# Patient Record
Sex: Female | Born: 1985 | Race: Black or African American | Hispanic: No | Marital: Single | Smoking: Never smoker
Health system: Southern US, Community
[De-identification: ages and names within clinical notes are randomized; demographics above are authoritative.]

## PROBLEM LIST (undated history)

## (undated) DIAGNOSIS — Z8619 Personal history of other infectious and parasitic diseases: Secondary | ICD-10-CM

## (undated) DIAGNOSIS — Z789 Other specified health status: Secondary | ICD-10-CM

## (undated) HISTORY — PX: ECTOPIC PREGNANCY SURGERY: SHX613

## (undated) HISTORY — DX: Personal history of other infectious and parasitic diseases: Z86.19

---

## 2013-05-03 NOTE — L&D Delivery Note (Signed)
Patient was C/C/+2 and pushed for a total of approx 45 minutes with epidural.   NSVD  female infant, Apgars 9/9, weight pending.   The patient had no lacerations. Fundus was firm. EBL was expected amount. Placenta was delivered intact. Vagina was clear.  Baby was vigorous and doing skin to skin with mother.  Philip AspenALLAHAN, Tara Ayers

## 2013-09-20 LAB — OB RESULTS CONSOLE RPR: RPR: NONREACTIVE

## 2013-09-20 LAB — OB RESULTS CONSOLE ANTIBODY SCREEN: Antibody Screen: NEGATIVE

## 2013-09-20 LAB — OB RESULTS CONSOLE HIV ANTIBODY (ROUTINE TESTING): HIV: NONREACTIVE

## 2013-09-20 LAB — OB RESULTS CONSOLE ABO/RH: RH TYPE: POSITIVE

## 2013-09-20 LAB — OB RESULTS CONSOLE HEPATITIS B SURFACE ANTIGEN: Hepatitis B Surface Ag: NEGATIVE

## 2013-09-20 LAB — OB RESULTS CONSOLE GC/CHLAMYDIA
Chlamydia: NEGATIVE
Gonorrhea: NEGATIVE

## 2013-09-20 LAB — OB RESULTS CONSOLE RUBELLA ANTIBODY, IGM: RUBELLA: IMMUNE

## 2014-01-23 ENCOUNTER — Other Ambulatory Visit (HOSPITAL_COMMUNITY): Payer: Self-pay | Admitting: Obstetrics and Gynecology

## 2014-01-23 ENCOUNTER — Other Ambulatory Visit: Payer: Self-pay

## 2014-01-23 DIAGNOSIS — O09292 Supervision of pregnancy with other poor reproductive or obstetric history, second trimester: Secondary | ICD-10-CM

## 2014-02-01 ENCOUNTER — Encounter (HOSPITAL_COMMUNITY): Payer: Self-pay

## 2014-02-01 ENCOUNTER — Ambulatory Visit (HOSPITAL_COMMUNITY)
Admission: RE | Admit: 2014-02-01 | Discharge: 2014-02-01 | Disposition: A | Discharge status: Home / Self Care | Payer: 59 | Source: Ambulatory Visit | Attending: Obstetrics and Gynecology | Admitting: Obstetrics and Gynecology

## 2014-02-01 DIAGNOSIS — O09292 Supervision of pregnancy with other poor reproductive or obstetric history, second trimester: Secondary | ICD-10-CM

## 2014-02-01 DIAGNOSIS — Z3A30 30 weeks gestation of pregnancy: Secondary | ICD-10-CM

## 2014-02-01 DIAGNOSIS — O09293 Supervision of pregnancy with other poor reproductive or obstetric history, third trimester: Secondary | ICD-10-CM

## 2014-02-01 DIAGNOSIS — Z3689 Encounter for other specified antenatal screening: Secondary | ICD-10-CM

## 2014-02-01 NOTE — Progress Notes (Signed)
MATERNAL FETAL MEDICINE CONSULT  Patient Name: Tara Ayers Medical Record Number:  [id number] Date of Birth: April 21, 1986 Requesting Physician Name:  Philip AspenSidney Callahan, DO Date of Service: 02/01/2014  Chief Complaint Prior intrauterine fetal demise  History of Present Illness Tara Ayers was seen today secondary to prior unexplained intrauterine fetal demise at 38 weeks at the request of Philip AspenSidney Callahan, DO.  The patient is a 28 y.o. [id number],at 2660w4d with an EDD of 04/08/2014, by Last Menstrual Period dating method.  She reports that she experienced decreased fetal movement in the days leading up to the diagnosis of the IUFD.  Thereafter she had a uncomplicated NSVD after an IOL.  She reports there umbilical cord was hypercoiled, but no clear etiology for the demise was uncovered.  She was induced at 38 weeks for oligohydramnios in her first successful pregnancy.  She has had no complication in her current pregnancy.  Review of Systems Pertinent items are noted in HPI.  Patient History OB History  Gravida Para Term Preterm AB SAB TAB Ectopic Multiple Living  6 2 2  0 3 2 0 1 0 1    # Outcome Date GA Lbr Len/2nd Weight Sex Delivery Anes PTL Lv  6 CUR           5 SAB 2014          4 SAB 2014          3 TRM 2014 6465w0d  5 lb 7 oz (2.466 kg)  SVD   N  2 TRM 2009 2926w0d  5 lb 11 oz (2.58 kg) F SVD   Y  1 ECT 2008              Past Medical and Surgical History Tara Ayers had a laparoscopy for treatment of and ectopic pregnancy.  Otherwise the patient has no history of chronic medical diseases or prior surgeries.  Family History The patient and the father of the baby have no family history of mental retardation, birth defects, or genetic diseases.  Physical Examination Vitals - BP 122/63, Pulse 101, Weight 170 lbs. General appearance - alert, well appearing, and in no distress  Assessment and Recommendations 1.  Prior unexplained IUFD at term.  Tara Ayers inquired about the  possibility of being delivered at approximately 37 weeks; however, an early term delivery is not indicated in this situation as the risk of a recurrent IUFD is exceedingly small.  Current guidelines call for delivery no earlier than 39 weeks unless a fetal or maternal indication arises.  Tara Ayers should continue to have weekly fetal testing until delivery.  I spent 15 minutes with Tara Ayers today of which 50% was face-to-face counseling.  [facility].  Please do not hesitate to contact us with questions.   Rema FendtNITSCHE,Derrin Currey, MD

## 2014-03-05 ENCOUNTER — Encounter (HOSPITAL_COMMUNITY): Payer: Self-pay

## 2014-03-06 LAB — OB RESULTS CONSOLE GBS: GBS: NEGATIVE

## 2014-03-27 ENCOUNTER — Encounter (HOSPITAL_COMMUNITY): Payer: Self-pay | Admitting: *Deleted

## 2014-03-27 ENCOUNTER — Telehealth (HOSPITAL_COMMUNITY): Payer: Self-pay | Admitting: *Deleted

## 2014-03-27 NOTE — Telephone Encounter (Signed)
Preadmission screen  

## 2014-03-29 ENCOUNTER — Inpatient Hospital Stay (HOSPITAL_COMMUNITY)
Admission: AD | Admit: 2014-03-29 | Discharge: 2014-04-01 | DRG: 775 | Disposition: A | Discharge status: Home / Self Care | Payer: 59 | Source: Ambulatory Visit | Attending: Obstetrics and Gynecology | Admitting: Obstetrics and Gynecology

## 2014-03-29 ENCOUNTER — Encounter (HOSPITAL_COMMUNITY): Payer: Self-pay | Admitting: *Deleted

## 2014-03-29 DIAGNOSIS — Z3490 Encounter for supervision of normal pregnancy, unspecified, unspecified trimester: Secondary | ICD-10-CM

## 2014-03-29 DIAGNOSIS — Z3A38 38 weeks gestation of pregnancy: Secondary | ICD-10-CM | POA: Diagnosis present

## 2014-03-29 DIAGNOSIS — O09293 Supervision of pregnancy with other poor reproductive or obstetric history, third trimester: Secondary | ICD-10-CM

## 2014-03-29 HISTORY — DX: Other specified health status: Z78.9

## 2014-03-29 LAB — POCT FERN TEST: POCT Fern Test: POSITIVE

## 2014-03-29 LAB — AMNISURE RUPTURE OF MEMBRANE (ROM) NOT AT ARMC: AMNISURE: POSITIVE

## 2014-03-29 MED ORDER — OXYTOCIN BOLUS FROM INFUSION: 500.0000 mL | INTRAVENOUS | Status: DC

## 2014-03-29 MED ORDER — LIDOCAINE HCL (PF) 1 % IJ SOLN
30.0000 mL | INTRAMUSCULAR | Status: DC | PRN
  Filled 2014-03-29: qty 30

## 2014-03-29 MED ORDER — OXYTOCIN 40 UNITS IN LACTATED RINGERS INFUSION - SIMPLE MED: 62.5000 mL/h | INTRAVENOUS | Status: DC

## 2014-03-29 MED ORDER — PHENYLEPHRINE 40 MCG/ML (10ML) SYRINGE FOR IV PUSH (FOR BLOOD PRESSURE SUPPORT)
80.0000 ug | PREFILLED_SYRINGE | INTRAVENOUS | Status: DC | PRN
  Filled 2014-03-29: qty 2

## 2014-03-29 MED ORDER — OXYCODONE-ACETAMINOPHEN 5-325 MG PO TABS: 1.0000 | ORAL_TABLET | ORAL | Status: DC | PRN

## 2014-03-29 MED ORDER — OXYCODONE-ACETAMINOPHEN 5-325 MG PO TABS: 2.0000 | ORAL_TABLET | ORAL | Status: DC | PRN

## 2014-03-29 MED ORDER — FENTANYL 2.5 MCG/ML BUPIVACAINE 1/10 % EPIDURAL INFUSION (WH - ANES)
14.0000 mL/h | INTRAMUSCULAR | Status: DC | PRN
  Administered 2014-03-30: 14 mL/h via EPIDURAL
  Filled 2014-03-29: qty 125

## 2014-03-29 MED ORDER — ACETAMINOPHEN 325 MG PO TABS: 650.0000 mg | ORAL_TABLET | ORAL | Status: DC | PRN

## 2014-03-29 MED ORDER — EPHEDRINE 5 MG/ML INJ
10.0000 mg | INTRAVENOUS | Status: DC | PRN
  Filled 2014-03-29: qty 2

## 2014-03-29 MED ORDER — DIPHENHYDRAMINE HCL 50 MG/ML IJ SOLN: 12.5000 mg | INTRAMUSCULAR | Status: DC | PRN

## 2014-03-29 MED ORDER — LACTATED RINGERS IV SOLN: 500.0000 mL | Freq: Once | INTRAVENOUS | Status: DC

## 2014-03-29 MED ORDER — LACTATED RINGERS IV SOLN
INTRAVENOUS | Status: DC
  Administered 2014-03-30: via INTRAVENOUS

## 2014-03-29 MED ORDER — LACTATED RINGERS IV SOLN
500.0000 mL | INTRAVENOUS | Status: DC | PRN
  Administered 2014-03-30: 1000 mL via INTRAVENOUS

## 2014-03-29 MED ORDER — OXYTOCIN 40 UNITS IN LACTATED RINGERS INFUSION - SIMPLE MED
1.0000 m[IU]/min | INTRAVENOUS | Status: DC
  Administered 2014-03-30: 2 m[IU]/min via INTRAVENOUS
  Filled 2014-03-29: qty 1000

## 2014-03-29 MED ORDER — CITRIC ACID-SODIUM CITRATE 334-500 MG/5ML PO SOLN
30.0000 mL | ORAL | Status: DC | PRN
  Administered 2014-03-30: 30 mL via ORAL
  Filled 2014-03-29: qty 15

## 2014-03-29 MED ORDER — TERBUTALINE SULFATE 1 MG/ML IJ SOLN: 0.2500 mg | Freq: Once | INTRAMUSCULAR | Status: AC | PRN

## 2014-03-29 MED ORDER — ONDANSETRON HCL 4 MG/2ML IJ SOLN: 4.0000 mg | Freq: Four times a day (QID) | INTRAMUSCULAR | Status: DC | PRN

## 2014-03-29 MED ORDER — FLEET ENEMA 7-19 GM/118ML RE ENEM: 1.0000 | ENEMA | RECTAL | Status: DC | PRN

## 2014-03-29 MED ORDER — PHENYLEPHRINE 40 MCG/ML (10ML) SYRINGE FOR IV PUSH (FOR BLOOD PRESSURE SUPPORT)
80.0000 ug | PREFILLED_SYRINGE | INTRAVENOUS | Status: DC | PRN
  Filled 2014-03-29: qty 2
  Filled 2014-03-29: qty 10

## 2014-03-29 NOTE — Progress Notes (Signed)
Admissions brought pt into MAU, pt wanting to go into bathroom due to fluid running out.  Pt to bathroom, collected slide. New pad provided.

## 2014-03-29 NOTE — MAU Note (Signed)
Leaking fld for about 20mins. Clear fld. Mild ctxs. 3cm last sve. For induction this Monday.

## 2014-03-30 ENCOUNTER — Inpatient Hospital Stay (HOSPITAL_COMMUNITY): Payer: 59 | Admitting: Anesthesiology

## 2014-03-30 ENCOUNTER — Encounter (HOSPITAL_COMMUNITY): Payer: Self-pay | Admitting: *Deleted

## 2014-03-30 LAB — CBC
HCT: 36 % (ref 36.0–46.0)
Hemoglobin: 12.5 g/dL (ref 12.0–15.0)
MCH: 33.3 pg (ref 26.0–34.0)
MCHC: 34.7 g/dL (ref 30.0–36.0)
MCV: 96 fL (ref 78.0–100.0)
Platelets: 189 10*3/uL (ref 150–400)
RBC: 3.75 MIL/uL — ABNORMAL LOW (ref 3.87–5.11)
RDW: 14.7 % (ref 11.5–15.5)
WBC: 9.1 10*3/uL (ref 4.0–10.5)

## 2014-03-30 LAB — ABO/RH: ABO/RH(D): O POS

## 2014-03-30 LAB — RPR

## 2014-03-30 LAB — TYPE AND SCREEN
ABO/RH(D): O POS
Antibody Screen: NEGATIVE

## 2014-03-30 MED ORDER — SENNOSIDES-DOCUSATE SODIUM 8.6-50 MG PO TABS
2.0000 | ORAL_TABLET | ORAL | Status: DC
  Administered 2014-03-30 – 2014-04-01 (×2): 2 via ORAL
  Filled 2014-03-30 (×2): qty 2

## 2014-03-30 MED ORDER — PRENATAL MULTIVITAMIN CH
1.0000 | ORAL_TABLET | Freq: Every day | ORAL | Status: DC
  Administered 2014-03-31: 1 via ORAL
  Filled 2014-03-30: qty 1

## 2014-03-30 MED ORDER — BENZOCAINE-MENTHOL 20-0.5 % EX AERO: 1.0000 "application " | INHALATION_SPRAY | CUTANEOUS | Status: DC | PRN

## 2014-03-30 MED ORDER — DIPHENHYDRAMINE HCL 25 MG PO CAPS: 25.0000 mg | ORAL_CAPSULE | Freq: Four times a day (QID) | ORAL | Status: DC | PRN

## 2014-03-30 MED ORDER — ONDANSETRON HCL 4 MG/2ML IJ SOLN: 4.0000 mg | INTRAMUSCULAR | Status: DC | PRN

## 2014-03-30 MED ORDER — LANOLIN HYDROUS EX OINT: TOPICAL_OINTMENT | CUTANEOUS | Status: DC | PRN

## 2014-03-30 MED ORDER — ONDANSETRON HCL 4 MG PO TABS: 4.0000 mg | ORAL_TABLET | ORAL | Status: DC | PRN

## 2014-03-30 MED ORDER — FENTANYL 2.5 MCG/ML BUPIVACAINE 1/10 % EPIDURAL INFUSION (WH - ANES)
INTRAMUSCULAR | Status: DC | PRN
  Administered 2014-03-30: 14 mL/h via EPIDURAL

## 2014-03-30 MED ORDER — WITCH HAZEL-GLYCERIN EX PADS
1.0000 | MEDICATED_PAD | CUTANEOUS | Status: DC | PRN
Start: 2014-03-30 — End: 2014-04-01

## 2014-03-30 MED ORDER — ZOLPIDEM TARTRATE 5 MG PO TABS: 5.0000 mg | ORAL_TABLET | Freq: Every evening | ORAL | Status: DC | PRN

## 2014-03-30 MED ORDER — IBUPROFEN 600 MG PO TABS
600.0000 mg | ORAL_TABLET | Freq: Four times a day (QID) | ORAL | Status: DC
Start: 2014-03-30 — End: 2014-04-01
  Administered 2014-03-30 – 2014-04-01 (×8): 600 mg via ORAL
  Filled 2014-03-30 (×8): qty 1

## 2014-03-30 MED ORDER — LIDOCAINE HCL (PF) 1 % IJ SOLN
INTRAMUSCULAR | Status: DC | PRN
  Administered 2014-03-30 (×2): 4 mL

## 2014-03-30 MED ORDER — SIMETHICONE 80 MG PO CHEW: 80.0000 mg | CHEWABLE_TABLET | ORAL | Status: DC | PRN

## 2014-03-30 MED ORDER — OXYCODONE-ACETAMINOPHEN 5-325 MG PO TABS: 2.0000 | ORAL_TABLET | ORAL | Status: DC | PRN

## 2014-03-30 MED ORDER — OXYCODONE-ACETAMINOPHEN 5-325 MG PO TABS
1.0000 | ORAL_TABLET | ORAL | Status: DC | PRN
  Administered 2014-03-31: 0.5 via ORAL
  Administered 2014-03-31 – 2014-04-01 (×3): 1 via ORAL
  Administered 2014-04-01: 0.5 via ORAL
  Filled 2014-03-30 (×5): qty 1

## 2014-03-30 MED ORDER — TETANUS-DIPHTH-ACELL PERTUSSIS 5-2.5-18.5 LF-MCG/0.5 IM SUSP: 0.5000 mL | Freq: Once | INTRAMUSCULAR | Status: DC

## 2014-03-30 MED ORDER — DIBUCAINE 1 % RE OINT: 1.0000 "application " | TOPICAL_OINTMENT | RECTAL | Status: DC | PRN

## 2014-03-30 MED ORDER — OXYTOCIN 40 UNITS IN LACTATED RINGERS INFUSION - SIMPLE MED: 1.0000 m[IU]/min | INTRAVENOUS | Status: DC

## 2014-03-30 NOTE — Anesthesia Procedure Notes (Signed)
Epidural Patient location during procedure: OB  Staffing Anesthesiologist: Linzy Laury R Performed by: anesthesiologist   Preanesthetic Checklist Completed: patient identified, pre-op evaluation, timeout performed, IV checked, risks and benefits discussed and monitors and equipment checked  Epidural Patient position: sitting Prep: site prepped and draped and DuraPrep Patient monitoring: heart rate Approach: midline Location: L3-L4 Injection technique: LOR air and LOR saline  Needle:  Needle type: Tuohy  Needle gauge: 17 G Needle length: 9 cm Needle insertion depth: 5 cm Catheter type: closed end flexible Catheter size: 19 Gauge Catheter at skin depth: 11 cm Test dose: negative  Assessment Sensory level: T8 Events: blood not aspirated, injection not painful, no injection resistance, negative IV test and no paresthesia  Additional Notes Reason for block:procedure for pain   

## 2014-03-30 NOTE — H&P (Signed)
28 y.o. 2349w5d  [name] comes in c/o SROM.  Otherwise has good fetal movement and no bleeding.  Past Medical History  Diagnosis Date  . Hx of trichomonal vaginitis   . Hx of chlamydia infection   . Hx of varicella   . Medical history non-contributory     Past Surgical History  Procedure Laterality Date  . Ectopic pregnancy surgery      OB History  Gravida Para Term Preterm AB SAB TAB Ectopic Multiple Living  6 2 2  0 3 2 0 1 0 1    # Outcome Date GA Lbr Len/2nd Weight Sex Delivery Anes PTL Lv  6 Current           5 SAB 2014          4 SAB 2014          3 Term 2014 733w0d  2.466 kg (5 lb 7 oz)  Vag-Spont   N  2 Term 2009 2451w0d  2.58 kg (5 lb 11 oz) F Vag-Spont   Y  1 Ectopic 2008              History   Social History  . Marital Status: Single    Spouse Name: N/A    Number of Children: N/A  . Years of Education: N/A   Occupational History  . Not on file.   Social History Main Topics  . Smoking status: Never Smoker   . Smokeless tobacco: Never Used  . Alcohol Use: No  . Drug Use: No  . Sexual Activity: Yes   Other Topics Concern  . Not on file   Social History Narrative   Review of patient's allergies indicates no known allergies.    Prenatal Transfer Tool  Maternal Diabetes: No Genetic Screening: Normal Maternal Ultrasounds/Referrals: Normal Fetal Ultrasounds or other Referrals:  None Maternal Substance Abuse:  No Significant Maternal Medications:  None Significant Maternal Lab Results: Lab values include: Group B Strep negative  Other PNC: uncomplicated.  Prior pregnancy 38 week fetal demise d/t cord accident    Filed Vitals:   03/30/14 0530  BP: 122/78  Pulse: 89  Temp:   Resp: 18     Lungs/Cor:  NAD Abdomen:  soft, gravid Ex:  no cords, erythema SVE:  3.5/60/-2 at presentation, now 9cm FHTs:  145, good STV, NST R Toco:  Irregular at presentation, now q2 with pitocin   A/P   Admit with SROM  Irregular ctx, will start pitocin  2x2  Epidural  Other routine care  GBS Neg  Tara Ayers

## 2014-03-30 NOTE — Anesthesia Preprocedure Evaluation (Signed)
Anesthesia Evaluation  Patient identified by MRN, date of birth, ID band Patient awake    Reviewed: Allergy & Precautions, H&P , NPO status , Patient's Chart, lab work & pertinent test results  Airway Mallampati: II TM Distance: >3 FB Neck ROM: Full    Dental no notable dental hx.    Pulmonary neg pulmonary ROS,  breath sounds clear to auscultation  Pulmonary exam normal       Cardiovascular negative cardio ROS  Rhythm:Regular Rate:Normal     Neuro/Psych negative neurological ROS  negative psych ROS   GI/Hepatic negative GI ROS, Neg liver ROS,   Endo/Other  negative endocrine ROS  Renal/GU negative Renal ROS     Musculoskeletal negative musculoskeletal ROS (+)   Abdominal   Peds  Hematology negative hematology ROS (+)   Anesthesia Other Findings   Reproductive/Obstetrics (+) Pregnancy                           Anesthesia Physical Anesthesia Plan  ASA: II  Anesthesia Plan: Epidural   Post-op Pain Management:    Induction:   Airway Management Planned:   Additional Equipment:   Intra-op Plan:   Post-operative Plan:   Informed Consent: I have reviewed the patients History and Physical, chart, labs and discussed the procedure including the risks, benefits and alternatives for the proposed anesthesia with the patient or authorized representative who has indicated his/her understanding and acceptance.     Plan Discussed with:   Anesthesia Plan Comments:         Anesthesia Quick Evaluation  

## 2014-03-30 NOTE — Anesthesia Postprocedure Evaluation (Signed)
  Anesthesia Post-op Note  Patient: Tara Ayers  Procedure(s) Performed: * No procedures listed *  Patient Location: Mother/Baby  Anesthesia Type:Epidural  Level of Consciousness: awake  Airway and Oxygen Therapy: Patient Spontanous Breathing  Post-op Pain: mild  Post-op Assessment: Patient's Cardiovascular Status Stable and Respiratory Function Stable  Post-op Vital Signs: stable  Last Vitals:  Filed Vitals:   03/30/14 1420  BP: 113/62  Pulse: 76  Temp: 37 C  Resp: 18    Complications: No apparent anesthesia complications

## 2014-03-30 NOTE — Lactation Note (Signed)
This note was copied from the chart of Tara Malori Sheek. Lactation Consultation Note Follow up visit at 14 hours of age.  Mom reports giving baby a bottle of 10mls formula about 1 hour ago, but plans to continue to try breastfeeding.  Mom knows to call for assist as needed.    Patient Name: Tara Ayers Reason for consult: Follow-up assessment   Maternal Data    Feeding Feeding Type: Formula  LATCH Score/Interventions                      Lactation Tools Discussed/Used     Consult Status Consult Status: Follow-up Date: 03/31/14 Follow-up type: In-patient    Tara Ayers, Tara Ayers Ayers, 10:23 PM

## 2014-03-30 NOTE — Lactation Note (Signed)
This note was copied from the chart of Tara Ayers. Lactation Consultation Note  Patient Name: Tara Ayers Today's Date: 03/30/2014 Reason for consult: Initial assessment of this mom and baby, just 5 hours pp.  This mom did not breastfeed her first child (now 316 yo) and is c/o nipple pain with latch attempts with her newborn.  LC discussed hand expression (RN has demonstrated) and suck training to help baby open mouth wider and avoid sucking in upper lip during latch which can cause soreness.  LC encouraged STS and cue feedings. Mom encouraged to feed baby 8-12 times/24 hours and with feeding cues. LC encouraged review of Baby and Me pp 9, 14 and 20-25 for STS and BF information. LC provided [facility] Resource brochure and reviewed [facility] services and list of community and web site resources.     Maternal Data Formula Feeding for Exclusion: Yes Reason for exclusion: Mother's choice to formula and breast feed on admission Has patient been taught Hand Expression?: Yes (per RN) Does the patient have breastfeeding experience prior to this delivery?: No  Feeding Feeding Type: Breast Fed  LATCH Score/Interventions Latch: Repeated attempts needed to sustain latch, nipple held in mouth throughout feeding, stimulation needed to elicit sucking reflex. Intervention(s): Adjust position;Assist with latch  Audible Swallowing: A few with stimulation Intervention(s): Skin to skin  Type of Nipple: Everted at rest and after stimulation  Comfort (Breast/Nipple): Filling, red/small blisters or bruises, mild/mod discomfort  Problem noted: Severe discomfort  Hold (Positioning): Assistance needed to correctly position infant at breast and maintain latch. Intervention(s): Breastfeeding basics reviewed;Support Pillows  LATCH Score: 6 (recent LATCH attept/assessment by RN)  Lactation Tools Discussed/Used   STS, cue feedings, hand expression Suck training  Consult Status Consult Status:  Follow-up Date: 03/31/14 Follow-up type: In-patient    Warrick ParisianBryant, Carold Eisner [facility] 03/30/2014, 12:45 PM

## 2014-03-31 LAB — CBC
HCT: 36 % (ref 36.0–46.0)
Hemoglobin: 12 g/dL (ref 12.0–15.0)
MCH: 32.5 pg (ref 26.0–34.0)
MCHC: 33.3 g/dL (ref 30.0–36.0)
MCV: 97.6 fL (ref 78.0–100.0)
PLATELETS: 166 10*3/uL (ref 150–400)
RBC: 3.69 MIL/uL — ABNORMAL LOW (ref 3.87–5.11)
RDW: 14.9 % (ref 11.5–15.5)
WBC: 10.6 10*3/uL — AB (ref 4.0–10.5)

## 2014-03-31 NOTE — Plan of Care (Signed)
Problem: Consults Goal: Postpartum Patient Education (See Patient Education module for education specifics.)  Outcome: Completed/Met Date Met:  03/31/14  Problem: Discharge Progression Outcomes Goal: Tolerating diet Outcome: Completed/Met Date Met:  43/01/48 Goal: Complications resolved/controlled Outcome: Completed/Met Date Met:  03/31/14 Goal: Pain controlled with appropriate interventions Outcome: Completed/Met Date Met:  03/31/14 Goal: Afebrile, VS remain stable at discharge Outcome: Completed/Met Date Met:  03/31/14

## 2014-03-31 NOTE — Plan of Care (Signed)
Problem: Phase II Progression Outcomes Goal: Pain controlled on oral analgesia Outcome: Completed/Met Date Met:  03/31/14 Goal: Progress activity as tolerated unless otherwise ordered Outcome: Completed/Met Date Met:  03/31/14 Goal: Afebrile, VS remain stable Outcome: Completed/Met Date Met:  03/31/14 Goal: Tolerating diet Outcome: Completed/Met Date Met:  03/31/14

## 2014-03-31 NOTE — Progress Notes (Signed)
Pt given half of perocet at her request

## 2014-03-31 NOTE — Progress Notes (Signed)
Patient is eating, ambulating, voiding.  Pain control is good.  Appropriate lochia.  No complaints.   Filed Vitals:   03/30/14 1030 03/30/14 1420 03/30/14 2210 03/31/14 0540  BP: 113/66 113/62 113/62 118/56  Pulse: 63 76 69 58  Temp: 98.8 F (37.1 C) 98.6 F (37 C) 97.8 F (36.6 C) 97.9 F (36.6 C)  TempSrc: Oral Oral  Oral  Resp: 18 18 18 18   Height:      Weight:      SpO2:        Fundus firm Perineum without swelling. No CT  Lab Results  Component Value Date   WBC 10.6* 03/31/2014   HGB 12.0 03/31/2014   HCT 36.0 03/31/2014   MCV 97.6 03/31/2014   PLT 166 03/31/2014    --/--/O POS, O POS (11/27 2350)  A/P Post partum day 1 Doing well. Would like to go home, but states baby's bili is being rechecked tonight.  If ok may desire evening discharge.  Routine care.    Philip AspenALLAHAN, Delise Simenson

## 2014-03-31 NOTE — Plan of Care (Signed)
Problem: Phase II Progression Outcomes Goal: Other Phase II Outcomes/Goals Outcome: Completed/Met Date Met:  03/31/14     

## 2014-03-31 NOTE — Lactation Note (Signed)
This note was copied from the chart of Tara Shontell Hoeger. Lactation Consultation Note RN reports mom says she wants to bottle feed and may breastfeed at home discussed benefits of breastfeeding with previous visit.  Mom to call for assist as needed.     Patient Name: Tara Emerson MonteAngelica Ayers Today's Date: 03/31/2014     Maternal Data    Feeding Feeding Type: Formula  LATCH Score/Interventions                      Lactation Tools Discussed/Used     Consult Status      Margrette Wynia, Arvella MerlesJana Lynn 03/31/2014, 8:50 PM

## 2014-04-01 ENCOUNTER — Inpatient Hospital Stay (HOSPITAL_COMMUNITY): Admission: RE | Admit: 2014-04-01 | Payer: 59 | Source: Ambulatory Visit

## 2014-04-01 MED ORDER — DOCUSATE SODIUM 100 MG PO CAPS: 100.0000 mg | ORAL_CAPSULE | Freq: Two times a day (BID) | ORAL | Status: AC

## 2014-04-01 MED ORDER — IBUPROFEN 600 MG PO TABS: 600.0000 mg | ORAL_TABLET | Freq: Four times a day (QID) | ORAL | Status: AC | PRN

## 2014-04-01 MED ORDER — OXYCODONE-ACETAMINOPHEN 5-325 MG PO TABS: 2.0000 | ORAL_TABLET | ORAL | Status: AC | PRN

## 2014-04-01 NOTE — Plan of Care (Signed)
Problem: Discharge Progression Outcomes Goal: Activity appropriate for discharge plan Outcome: Completed/Met Date Met:  04/01/14     

## 2014-04-01 NOTE — Plan of Care (Signed)
Problem: Discharge Progression Outcomes Goal: Barriers To Progression Addressed/Resolved Outcome: Not Applicable Date Met:  29/42/62 Goal: Discharge plan in place and appropriate Outcome: Completed/Met Date Met:  04/01/14 Goal: Other Discharge Outcomes/Goals Outcome: Not Applicable Date Met:  70/04/84

## 2014-04-01 NOTE — Discharge Summary (Signed)
Obstetric Discharge Summary Reason for Admission: onset of labor Prenatal Procedures: NST and ultrasound Intrapartum Procedures: spontaneous vaginal delivery Postpartum Procedures: none Complications-Operative and Postpartum: none HEMOGLOBIN  Date Value Ref Range Status  03/31/2014 12.0 12.0 - 15.0 g/dL Final   HCT  Date Value Ref Range Status  03/31/2014 36.0 36.0 - 46.0 % Final    Physical Exam:  General: alert, cooperative and appears stated age 6Lochia: appropriate Uterine Fundus: firm  Discharge Diagnoses: Term Pregnancy-delivered  Discharge Information: Date: 04/01/2014 Activity: pelvic rest Diet: routine Medications: Ibuprofen, Colace and Percocet Condition: improved Instructions: refer to practice specific booklet Discharge to: home Follow-up Information    Follow up with CALLAHAN, SIDNEY, DO In 4 weeks.   Specialty:  Obstetrics and Gynecology   Why:  For a postpartum Evaluation   Contact information:   [address] [place] [address] [phone]       Newborn Data: Live born female  Birth Weight: 6 lb 11.6 oz (3050 g) APGAR: 9, 9  Home with mother.  Tynell Winchell H. 04/01/2014, 9:05 AM

## 2016-06-12 IMAGING — US US OB DETAIL+14 WK
1 series · 12 of 28 positions shown · non-contrast
Comparison: none

[Series 1: us ob detail+14 wk · 0.26mm/px · 12 of 75 slices shown]
[im 3/75]
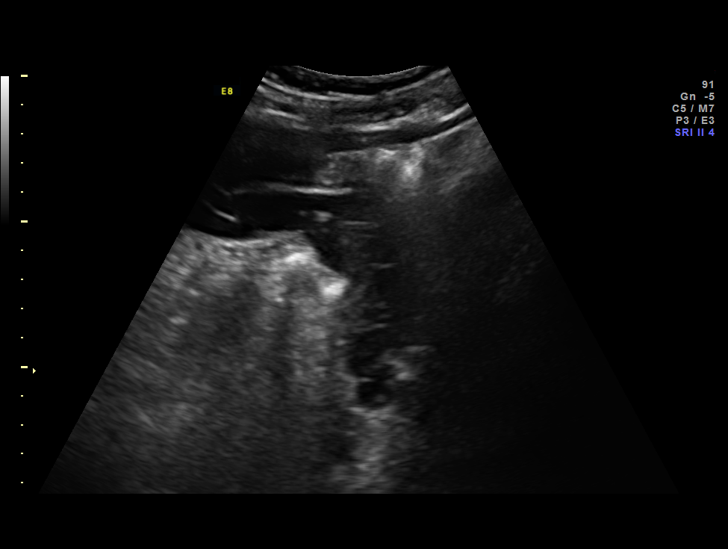
[im 9/75]
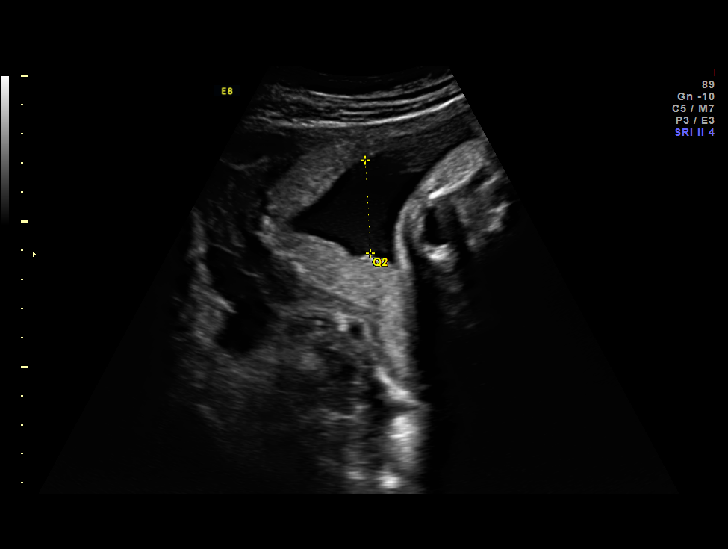
[im 14/75]
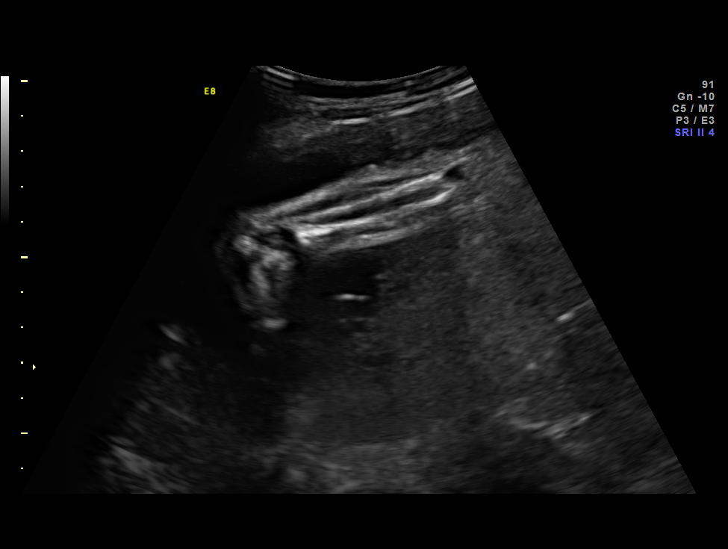
[im 22/75]
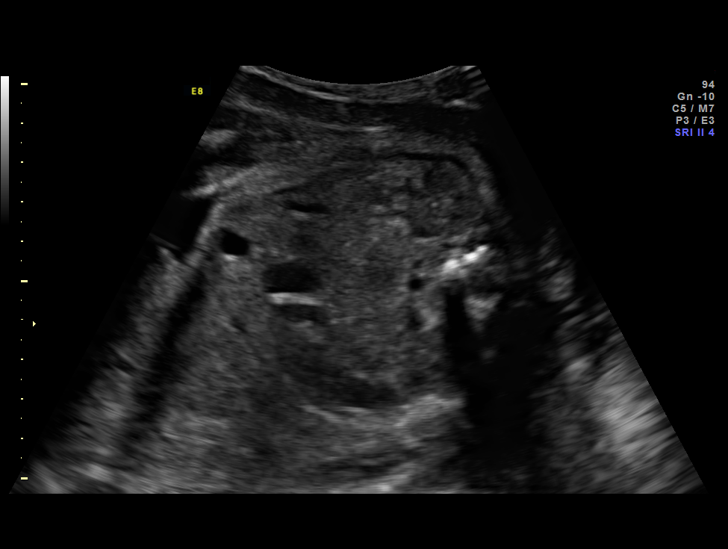
[im 28/75]
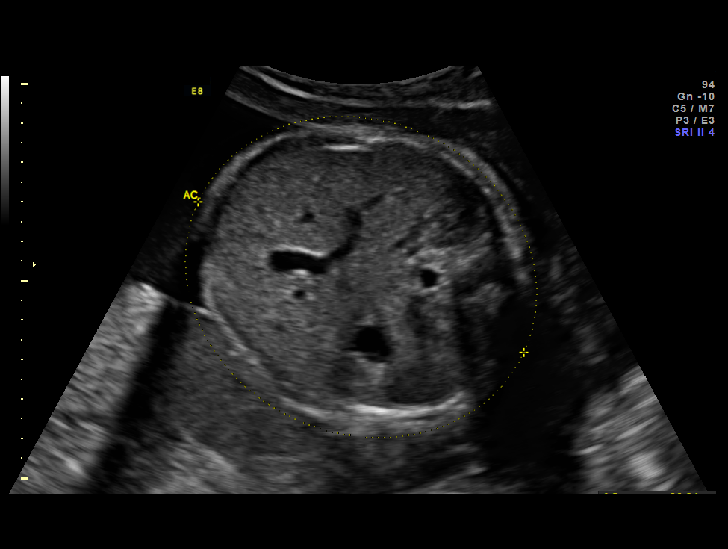
[im 33/75]
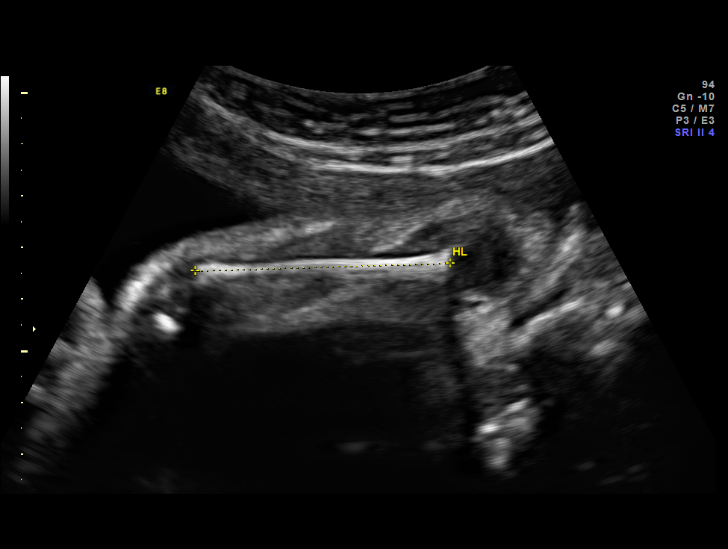
[im 42/75]
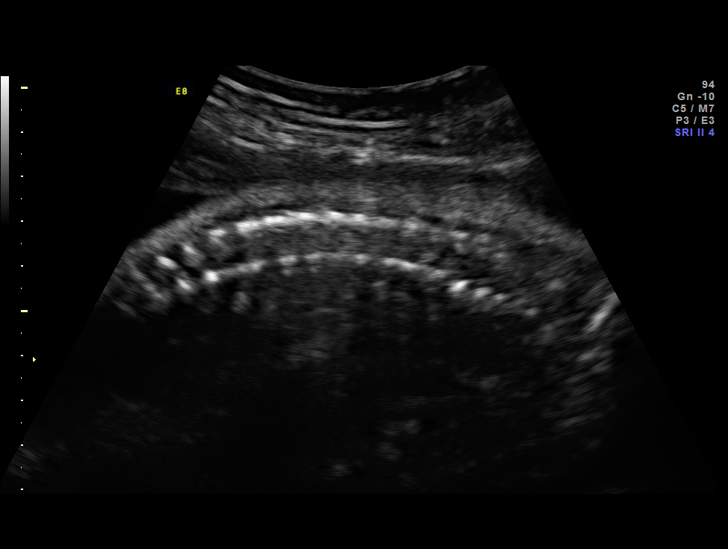
[im 47/75]
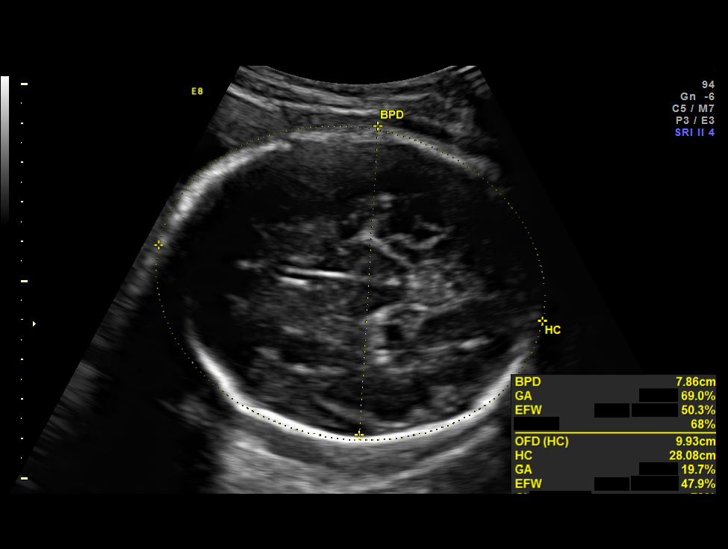
[im 53/75]
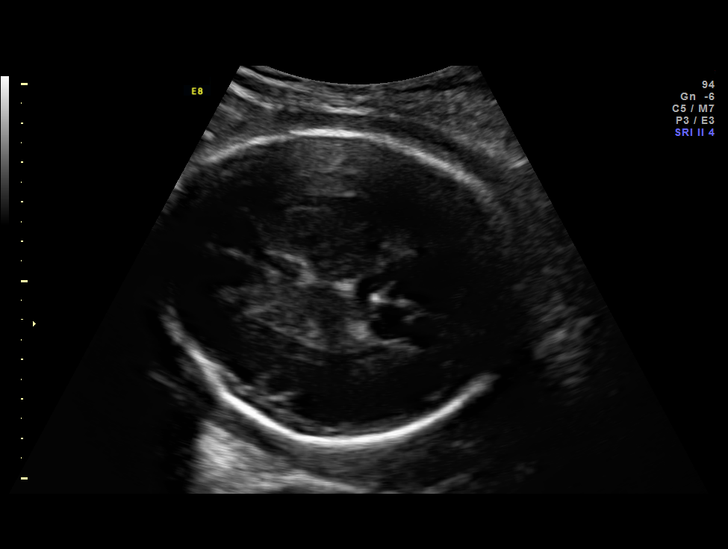
[im 61/75]
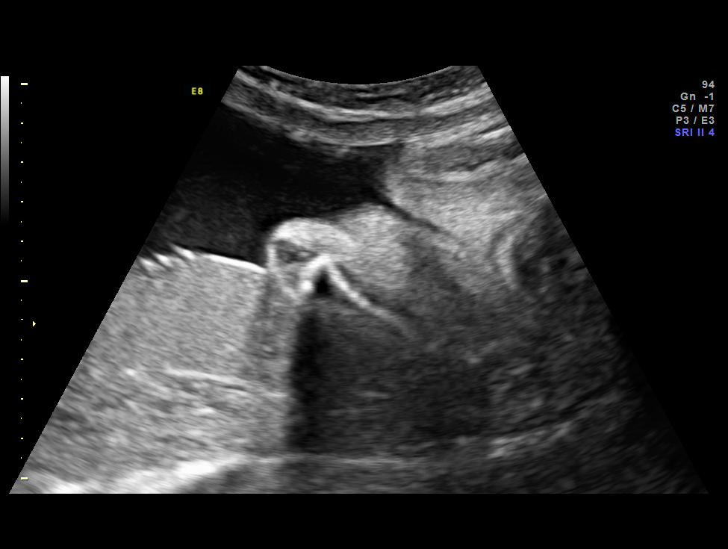
[im 66/75]
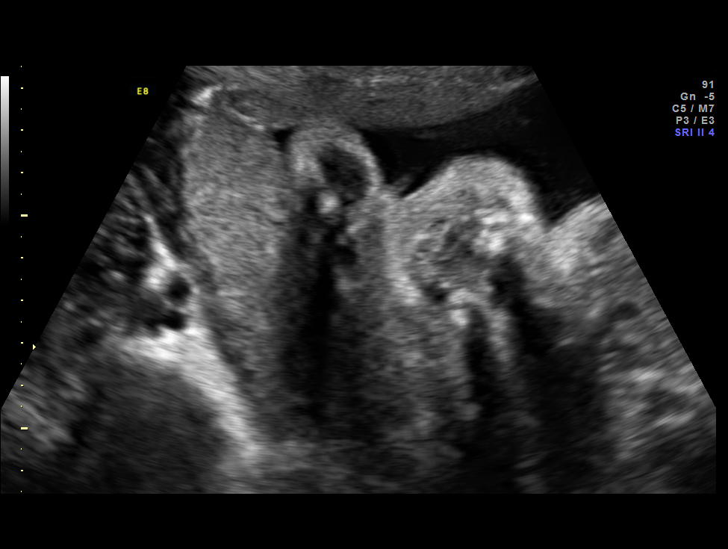
[im 72/75]
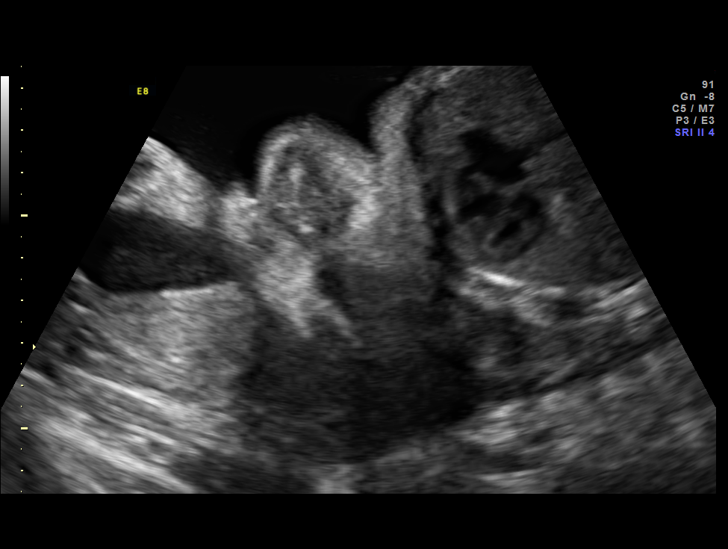

[12 of 28 positions shown; findings below may reference images not displayed]

OBSTETRICS REPORT
                      (Signed Final 02/01/2014 [DATE])

Service(s) Provided

 US OB DETAIL + 14 WK                                  76811.0
Indications

 Detailed fetal anatomic survey                        Z36
 30 weeks gestation of pregnancy
 Poor obstetric history: Previous fetal growth
 restriction (FGR)
 Poor obstetric history: Previous IUFD (81weeks)
Fetal Evaluation

 Num Of Fetuses:    1
 Fetal Heart Rate:  144                          bpm
 Cardiac Activity:  Observed
 Presentation:      Cephalic
 Placenta:          Posterior, above cervical
                    os
 P. Cord            Visualized
 Insertion:

 Amniotic Fluid
 AFI FV:      Subjectively within normal limits
 AFI Sum:     19.2    cm       73  %Tile     Larg Pckt:     8.1  cm
 RUQ:   4.28    cm   RLQ:    8.1    cm    LUQ:   3.21    cm   LLQ:    3.61   cm
Biometry

 BPD:     78.1  mm     G. Age:  31w 2d                CI:         77.4   70 - 86
 OFD:    100.9  mm                                    FL/HC:      18.9   19.3 -

 HC:       285  mm     G. Age:  31w 2d       34  %    HC/AC:      1.06   0.96 -

 AC:     269.1  mm     G. Age:  31w 0d       59  %    FL/BPD:     69.1   71 - 87
 FL:        54  mm     G. Age:  28w 4d      < 3  %    FL/AC:      20.1   20 - 24
 HUM:     49.3  mm     G. Age:  29w 0d       18  %

 Est. FW:    0828  gm      3 lb 7 oz     49  %
Gestational Age

 LMP:           30w 4d        Date:  07/02/13                 EDD:   04/08/14
 U/S Today:     30w 4d                                        EDD:   04/08/14
 Best:          30w 4d     Det. By:  LMP  (07/02/13)          EDD:   04/08/14
Anatomy

 Cranium:          Appears normal         Aortic Arch:      Appears normal
 Fetal Cavum:      Appears normal         Diaphragm:        Appears normal
 Ventricles:       Appears normal         Stomach:          Appears normal, left
                                                            sided
 Choroid Plexus:   Appears normal         Abdomen:          Appears normal
 Cerebellum:       Appears normal         Abdominal Wall:   Appears nml (cord
                                                            insert, abd wall)
 Posterior Fossa:  Appears normal         Cord Vessels:     Appears normal (3
                                                            vessel cord)
 Nuchal Fold:      Not applicable (>20    Kidneys:          Appear normal
                   wks GA)
 Face:             Appears normal         Bladder:          Appears normal
                   (orbits and profile)
 Lips:             Appears normal         Spine:            Appears normal
 Heart:            Appears normal         Lower             Appears normal
                   (4CH, axis, and        Extremities:
                   situs)
 RVOT:             Appears normal         Upper             Appears normal
                                          Extremities:
 LVOT:             Appears normal

 Other:  Fetus appears to be a female. Heels appears normal.
Cervix Uterus Adnexa

 Cervix:       Not visualized (advanced GA >26wks)
Comments

 The patient's fetal anatomic survey is now complete.  No fetal
 anomalies or soft markers of aneuploidy were seen.  Results
 of scan discussed with patient, as well as the limitations of
 U/S to detect all fetal anomalies and aneuploidies.

 The patient was also seen for an IMPARABLE evaluation.  Please see
 accompanying documentation for further details.
Impression

 Single living intrauterine pregnancy at 30 weeks 4 days.
 Appropriate fetal growth (49%).
 Normal amniotic fluid volume.
 Normal fetal anatomy.
 No fetal anomalies or soft markers of aneuploidy seen.
Recommendations

 Follow-up ultrasounds as clinically indicated.

                Deyoung, Jo Jo
